# Patient Record
Sex: Male | Born: 1985 | Race: Black or African American | Hispanic: No | Marital: Single | Smoking: Current every day smoker
Health system: Southern US, Community
[De-identification: ages and names within clinical notes are randomized; demographics above are authoritative.]

## PROBLEM LIST (undated history)

## (undated) DIAGNOSIS — F329 Major depressive disorder, single episode, unspecified: Secondary | ICD-10-CM

## (undated) DIAGNOSIS — F319 Bipolar disorder, unspecified: Secondary | ICD-10-CM

## (undated) DIAGNOSIS — F250 Schizoaffective disorder, bipolar type: Secondary | ICD-10-CM

## (undated) DIAGNOSIS — F32A Depression, unspecified: Secondary | ICD-10-CM

## (undated) DIAGNOSIS — F259 Schizoaffective disorder, unspecified: Secondary | ICD-10-CM

---

## 2005-08-01 ENCOUNTER — Emergency Department: Payer: Self-pay | Admitting: Emergency Medicine

## 2010-03-27 ENCOUNTER — Emergency Department: Payer: Self-pay | Admitting: Emergency Medicine

## 2012-08-19 ENCOUNTER — Emergency Department: Payer: Self-pay | Admitting: Emergency Medicine

## 2015-06-30 ENCOUNTER — Encounter: Payer: Self-pay | Admitting: Emergency Medicine

## 2015-06-30 ENCOUNTER — Emergency Department

## 2015-06-30 ENCOUNTER — Emergency Department
Admission: EM | Admit: 2015-06-30 | Discharge: 2015-07-01 | Attending: Emergency Medicine | Admitting: Emergency Medicine

## 2015-06-30 DIAGNOSIS — F17210 Nicotine dependence, cigarettes, uncomplicated: Secondary | ICD-10-CM | POA: Diagnosis not present

## 2015-06-30 DIAGNOSIS — F250 Schizoaffective disorder, bipolar type: Secondary | ICD-10-CM | POA: Insufficient documentation

## 2015-06-30 DIAGNOSIS — S40212A Abrasion of left shoulder, initial encounter: Secondary | ICD-10-CM | POA: Insufficient documentation

## 2015-06-30 DIAGNOSIS — X58XXXA Exposure to other specified factors, initial encounter: Secondary | ICD-10-CM | POA: Diagnosis not present

## 2015-06-30 DIAGNOSIS — Y929 Unspecified place or not applicable: Secondary | ICD-10-CM | POA: Insufficient documentation

## 2015-06-30 DIAGNOSIS — M545 Low back pain, unspecified: Secondary | ICD-10-CM

## 2015-06-30 DIAGNOSIS — Y939 Activity, unspecified: Secondary | ICD-10-CM | POA: Insufficient documentation

## 2015-06-30 DIAGNOSIS — Y999 Unspecified external cause status: Secondary | ICD-10-CM | POA: Insufficient documentation

## 2015-06-30 DIAGNOSIS — F319 Bipolar disorder, unspecified: Secondary | ICD-10-CM | POA: Insufficient documentation

## 2015-06-30 DIAGNOSIS — M25531 Pain in right wrist: Secondary | ICD-10-CM | POA: Insufficient documentation

## 2015-06-30 DIAGNOSIS — T148XXA Other injury of unspecified body region, initial encounter: Secondary | ICD-10-CM

## 2015-06-30 DIAGNOSIS — M25512 Pain in left shoulder: Secondary | ICD-10-CM | POA: Diagnosis present

## 2015-06-30 DIAGNOSIS — T1490XA Injury, unspecified, initial encounter: Secondary | ICD-10-CM

## 2015-06-30 DIAGNOSIS — R52 Pain, unspecified: Secondary | ICD-10-CM

## 2015-06-30 HISTORY — DX: Depression, unspecified: F32.A

## 2015-06-30 HISTORY — DX: Bipolar disorder, unspecified: F31.9

## 2015-06-30 HISTORY — DX: Schizoaffective disorder, unspecified: F25.9

## 2015-06-30 HISTORY — DX: Schizoaffective disorder, bipolar type: F25.0

## 2015-06-30 HISTORY — DX: Major depressive disorder, single episode, unspecified: F32.9

## 2015-06-30 LAB — RAPID HIV SCREEN (HIV 1/2 AB+AG)
HIV 1/2 Antibodies: NONREACTIVE
HIV-1 P24 Antigen - HIV24: NONREACTIVE
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
## 2015-06-30 NOTE — ED Notes (Signed)
MD at bedside. 
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
## 2015-06-30 NOTE — ED Notes (Signed)
Patient transported to X-ray. Pt ambulated with steady gait.

## 2015-06-30 NOTE — Discharge Instructions (Signed)
You may take Tylenol and/or ibuprofen as needed for discomfort. Return to the ER for worsening symptoms, persistent vomiting, difficulty breathing or other concerns.  Back Pain, Adult Back pain is very common in adults.The cause of back pain is rarely dangerous and the pain often gets better over time.The cause of your back pain may not be known. Some common causes of back pain include:  Strain of the muscles or ligaments supporting the spine.  Wear and tear (degeneration) of the spinal disks.  Arthritis.  Direct injury to the back. For many people, back pain may return. Since back pain is rarely dangerous, most people can learn to manage this condition on their own. HOME CARE INSTRUCTIONS Watch your back pain for any changes. The following actions may help to lessen any discomfort you are feeling:  Remain active. It is stressful on your back to sit or stand in one place for long periods of time. Do not sit, drive, or stand in one place for more than 30 minutes at a time. Take short walks on even surfaces as soon as you are able.Try to increase the length of time you walk each day.  Exercise regularly as directed by your health care provider. Exercise helps your back heal faster. It also helps avoid future injury by keeping your muscles strong and flexible.  Do not stay in bed.Resting more than 1-2 days can delay your recovery.  Pay attention to your body when you bend and lift. The most comfortable positions are those that put less stress on your recovering back. Always use proper lifting techniques, including:  Bending your knees.  Keeping the load close to your body.  Avoiding twisting.  Find a comfortable position to sleep. Use a firm mattress and lie on your side with your knees slightly bent. If you lie on your back, put a pillow under your knees.  Avoid feeling anxious or stressed.Stress increases muscle tension and can worsen back pain.It is important to recognize when  you are anxious or stressed and learn ways to manage it, such as with exercise.  Take medicines only as directed by your health care provider. Over-the-counter medicines to reduce pain and inflammation are often the most helpful.Your health care provider may prescribe muscle relaxant drugs.These medicines help dull your pain so you can more quickly return to your normal activities and healthy exercise.  Apply ice to the injured area:  Put ice in a plastic bag.  Place a towel between your skin and the bag.  Leave the ice on for 20 minutes, 2-3 times a day for the first 2-3 days. After that, ice and heat may be alternated to reduce pain and spasms.  Maintain a healthy weight. Excess weight puts extra stress on your back and makes it difficult to maintain good posture. SEEK MEDICAL CARE IF:  You have pain that is not relieved with rest or medicine.  You have increasing pain going down into the legs or buttocks.  You have pain that does not improve in one week.  You have night pain.  You lose weight.  You have a fever or chills. SEEK IMMEDIATE MEDICAL CARE IF:   You develop new bowel or bladder control problems.  You have unusual weakness or numbness in your arms or legs.  You develop nausea or vomiting.  You develop abdominal pain.  You feel faint.   This information is not intended to replace advice given to you by your health care provider. Make sure you discuss any  questions you have with your health care provider.   Document Released: 03/13/2005 Document Revised: 04/03/2014 Document Reviewed: 07/15/2013 Elsevier Interactive Patient Education 2016 Else[employer]  Shoulder Pain The shoulder is the joint that connects your arms to your body. The bones that form the shoulder joint include the upper arm bone (humerus), the shoulder blade (scapula), and the collarbone (clavicle). The top of the humerus is shaped like a ball and fits into a rather flat socket on the scapula  (glenoid cavity). A combination of muscles and strong, fibrous tissues that connect muscles to bones (tendons) support your shoulder joint and hold the ball in the socket. Small, fluid-filled sacs (bursae) are located in different areas of the joint. They act as cushions between the bones and the overlying soft tissues and help reduce friction between the gliding tendons and the bone as you move your arm. Your shoulder joint allows a wide range of motion in your arm. This range of motion allows you to do things like scratch your back or throw a ball. However, this range of motion also makes your shoulder more prone to pain from overuse and injury. Causes of shoulder pain can originate from both injury and overuse and usually can be grouped in the following four categories:  Redness, swelling, and pain (inflammation) of the tendon (tendinitis) or the bursae (bursitis).  Instability, such as a dislocation of the joint.  Inflammation of the joint (arthritis).  Broken bone (fracture). HOME CARE INSTRUCTIONS   Apply ice to the sore area.  Put ice in a plastic bag.  Place a towel between your skin and the bag.  Leave the ice on for 15-20 minutes, 3-4 times per day for the first 2 days, or as directed by your health care provider.  Stop using cold packs if they do not help with the pain.  If you have a shoulder sling or immobilizer, wear it as long as your caregiver instructs. Only remove it to shower or bathe. Move your arm as little as possible, but keep your hand moving to prevent swelling.  Squeeze a soft ball or foam pad as much as possible to help prevent swelling.  Only take over-the-counter or prescription medicines for pain, discomfort, or fever as directed by your caregiver. SEEK MEDICAL CARE IF:   Your shoulder pain increases, or new pain develops in your arm, hand, or fingers.  Your hand or fingers become cold and numb.  Your pain is not relieved with medicines. SEEK IMMEDIATE  MEDICAL CARE IF:   Your arm, hand, or fingers are numb or tingling.  Your arm, hand, or fingers are significantly swollen or turn white or blue. MAKE SURE YOU:   Understand these instructions.  Will watch your condition.  Will get help right away if you are not doing well or get worse.   This information is not intended to replace advice given to you by your health care provider. Make sure you discuss any questions you have with your health care provider.   Document Released: 12/21/2004 Document Revised: 04/03/2014 Document Reviewed: 07/06/2014 Elsevier Interactive Patient Education 2016 Else[employer] Inc.  Wrist Pain There are many things that can cause wrist pain. Some common causes include:  An injury to the wrist area, such as a sprain, strain, or fracture.  Overuse of the joint.  A condition that causes increased pressure on a nerve in the wrist (carpal tunnel syndrome).  Wear and tear of the joints that occurs with aging (osteoarthritis).  A variety of  other types of arthritis. Sometimes, the cause of wrist pain is not known. The pain often goes away when you follow your health care provider's instructions for relieving pain at home. If your wrist pain continues, tests may need to be done to diagnose your condition. HOME CARE INSTRUCTIONS Pay attention to any changes in your symptoms. Take these actions to help with your pain:  Rest the wrist area for at least 48 hours or as told by your health care provider.  If directed, apply ice to the injured area:  Put ice in a plastic bag.  Place a towel between your skin and the bag.  Leave the ice on for 20 minutes, 2-3 times per day.  Keep your arm raised (elevated) above the level of your heart while you are sitting or lying down.  If a splint or elastic bandage has been applied, use it as told by your health care provider.  Remove the splint or bandage only as told by your health care provider.  Loosen the splint or  bandage if your fingers become numb or have a tingling feeling, or if they turn cold or blue.  Take over-the-counter and prescription medicines only as told by your health care provider.  Keep all follow-up visits as told by your health care provider. This is important. SEEK MEDICAL CARE IF:  Your pain is not helped by treatment.  Your pain gets worse. SEEK IMMEDIATE MEDICAL CARE IF:  Your fingers become swollen.  Your fingers turn white, very red, or cold and blue.  Your fingers are numb or have a tingling feeling.  You have difficulty moving your fingers.   This information is not intended to replace advice given to you by your health care provider. Make sure you discuss any questions you have with your health care provider.   Document Released: 12/21/2004 Document Revised: 12/02/2014 Document Reviewed: 07/29/2014 Elsevier Interactive Patient Education [employer]

## 2015-07-01 NOTE — ED Notes (Signed)
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
## 2016-12-14 ENCOUNTER — Emergency Department
Admission: EM | Admit: 2016-12-14 | Discharge: 2016-12-14 | Disposition: A | Discharge status: Home / Self Care | Payer: Self-pay | Attending: Student in an Organized Health Care Education/Training Program | Admitting: Student in an Organized Health Care Education/Training Program

## 2016-12-14 DIAGNOSIS — R55 Syncope and collapse: Secondary | ICD-10-CM | POA: Insufficient documentation

## 2016-12-14 DIAGNOSIS — T672XXA Heat cramp, initial encounter: Secondary | ICD-10-CM | POA: Insufficient documentation

## 2016-12-14 DIAGNOSIS — E860 Dehydration: Secondary | ICD-10-CM | POA: Insufficient documentation

## 2016-12-14 DIAGNOSIS — F17210 Nicotine dependence, cigarettes, uncomplicated: Secondary | ICD-10-CM | POA: Insufficient documentation

## 2016-12-14 LAB — COMPREHENSIVE METABOLIC PANEL
ALBUMIN: 5 g/dL (ref 3.5–5.0)
ALK PHOS: 40 U/L (ref 38–126)
ALT: 22 U/L (ref 17–63)
ANION GAP: 14 (ref 5–15)
AST: 39 U/L (ref 15–41)
BILIRUBIN TOTAL: 1.1 mg/dL (ref 0.3–1.2)
BUN: 17 mg/dL (ref 6–20)
CALCIUM: 10.3 mg/dL (ref 8.9–10.3)
CO2: 21 mmol/L — ABNORMAL LOW (ref 22–32)
CREATININE: 1.17 mg/dL (ref 0.61–1.24)
Chloride: 99 mmol/L — ABNORMAL LOW (ref 101–111)
GFR calc Af Amer: >60 mL/min (ref >60)
GFR calc non Af Amer: >60 mL/min (ref >60)
GLUCOSE: 96 mg/dL (ref 65–99)
Potassium: 3.1 mmol/L — ABNORMAL LOW (ref 3.5–5.1)
Sodium: 134 mmol/L — ABNORMAL LOW (ref 135–145)
TOTAL PROTEIN: 8.6 g/dL — AB (ref 6.5–8.1)

## 2016-12-14 LAB — CK
Total CK: 403 U/L — ABNORMAL HIGH (ref 49–397)
Total CK: 345 U/L (ref 49–397)

## 2016-12-14 LAB — CBC WITH DIFFERENTIAL/PLATELET
Basophils Absolute: 0.1 10*3/uL (ref 0–0.1)
Basophils Relative: 1 %
EOS ABS: 0.1 10*3/uL (ref 0–0.7)
Eosinophils Relative: 2 %
HEMATOCRIT: 44.4 % (ref 40.0–52.0)
HEMOGLOBIN: 15.2 g/dL (ref 13.0–18.0)
LYMPHS ABS: 3.9 10*3/uL — AB (ref 1.0–3.6)
Lymphocytes Relative: 50 %
MCH: 30.4 pg (ref 26.0–34.0)
MCHC: 34.3 g/dL (ref 32.0–36.0)
MCV: 88.7 fL (ref 80.0–100.0)
MONO ABS: 0.6 10*3/uL (ref 0.2–1.0)
MONOS PCT: 8 %
NEUTROS ABS: 3 10*3/uL (ref 1.4–6.5)
NEUTROS PCT: 39 %
Platelets: 244 10*3/uL (ref 150–440)
RBC: 5.01 MIL/uL (ref 4.40–5.90)
RDW: 13.3 % (ref 11.5–14.5)
WBC: 7.6 10*3/uL (ref 3.8–10.6)

## 2016-12-14 MED ORDER — SODIUM CHLORIDE 0.9 % IV BOLUS (SEPSIS)
1000.0000 mL | Freq: Once | INTRAVENOUS | Status: AC
  Administered 2016-12-14: 1000 mL via INTRAVENOUS

## 2016-12-14 MED ORDER — POTASSIUM CHLORIDE CRYS ER 20 MEQ PO TBCR
30.0000 meq | EXTENDED_RELEASE_TABLET | Freq: Once | ORAL | Status: AC
  Administered 2016-12-14: 30 meq via ORAL
  Filled 2016-12-14: qty 2
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
## 2016-12-14 NOTE — ED Triage Notes (Signed)
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

## 2016-12-14 NOTE — ED Notes (Signed)
See triage note for information about what brought pt to the ED

## 2017-08-16 ENCOUNTER — Emergency Department: Payer: Self-pay

## 2017-08-16 ENCOUNTER — Emergency Department
Admission: EM | Admit: 2017-08-16 | Discharge: 2017-08-16 | Disposition: A | Discharge status: Home / Self Care | Payer: Self-pay | Attending: Emergency Medicine | Admitting: Emergency Medicine

## 2017-08-16 ENCOUNTER — Other Ambulatory Visit: Payer: Self-pay

## 2017-08-16 ENCOUNTER — Encounter: Payer: Self-pay | Admitting: *Deleted

## 2017-08-16 DIAGNOSIS — S70361A Insect bite (nonvenomous), right thigh, initial encounter: Secondary | ICD-10-CM | POA: Insufficient documentation

## 2017-08-16 DIAGNOSIS — Y939 Activity, unspecified: Secondary | ICD-10-CM | POA: Insufficient documentation

## 2017-08-16 DIAGNOSIS — Y999 Unspecified external cause status: Secondary | ICD-10-CM | POA: Insufficient documentation

## 2017-08-16 DIAGNOSIS — F17210 Nicotine dependence, cigarettes, uncomplicated: Secondary | ICD-10-CM | POA: Insufficient documentation

## 2017-08-16 DIAGNOSIS — Y929 Unspecified place or not applicable: Secondary | ICD-10-CM | POA: Insufficient documentation

## 2017-08-16 DIAGNOSIS — W57XXXA Bitten or stung by nonvenomous insect and other nonvenomous arthropods, initial encounter: Secondary | ICD-10-CM | POA: Insufficient documentation

## 2017-08-16 DIAGNOSIS — J039 Acute tonsillitis, unspecified: Secondary | ICD-10-CM | POA: Insufficient documentation

## 2017-08-16 LAB — BASIC METABOLIC PANEL
ANION GAP: 9 (ref 5–15)
BUN: 10 mg/dL (ref 6–20)
CALCIUM: 9.6 mg/dL (ref 8.9–10.3)
CO2: 27 mmol/L (ref 22–32)
Chloride: 99 mmol/L — ABNORMAL LOW (ref 101–111)
Creatinine, Ser: 1.14 mg/dL (ref 0.61–1.24)
GFR calc Af Amer: >60 mL/min (ref >60)
GLUCOSE: 97 mg/dL (ref 65–99)
Potassium: 3.6 mmol/L (ref 3.5–5.1)
SODIUM: 135 mmol/L (ref 135–145)

## 2017-08-16 LAB — CBC WITH DIFFERENTIAL/PLATELET
BASOS ABS: 0 10*3/uL (ref 0–0.1)
BASOS PCT: 0 %
EOS PCT: 0 %
Eosinophils Absolute: 0 10*3/uL (ref 0–0.7)
HCT: 43.6 % (ref 40.0–52.0)
Hemoglobin: 15 g/dL (ref 13.0–18.0)
Lymphocytes Relative: 9 %
Lymphs Abs: 0.8 10*3/uL — ABNORMAL LOW (ref 1.0–3.6)
MCH: 30.6 pg (ref 26.0–34.0)
MCHC: 34.3 g/dL (ref 32.0–36.0)
MCV: 89.2 fL (ref 80.0–100.0)
MONO ABS: 1 10*3/uL (ref 0.2–1.0)
Monocytes Relative: 12 %
Neutro Abs: 6.9 10*3/uL — ABNORMAL HIGH (ref 1.4–6.5)
Neutrophils Relative %: 79 %
Platelets: 240 10*3/uL (ref 150–440)
RBC: 4.89 MIL/uL (ref 4.40–5.90)
RDW: 14.1 % (ref 11.5–14.5)
WBC: 8.8 10*3/uL (ref 3.8–10.6)

## 2017-08-16 MED ORDER — SODIUM CHLORIDE 0.9 % IV BOLUS
1000.0000 mL | Freq: Once | INTRAVENOUS | Status: AC
  Administered 2017-08-16: 1000 mL via INTRAVENOUS

## 2017-08-16 MED ORDER — AMOXICILLIN 875 MG PO TABS: 875.0000 mg | ORAL_TABLET | Freq: Two times a day (BID) | ORAL | 0 refills | Status: AC

## 2017-08-16 MED ORDER — ACETAMINOPHEN 500 MG PO TABS
1000.0000 mg | ORAL_TABLET | Freq: Once | ORAL | Status: AC
  Administered 2017-08-16: 1000 mg via ORAL

## 2017-08-16 MED ORDER — IOPAMIDOL (ISOVUE-300) INJECTION 61%
75.0000 mL | Freq: Once | INTRAVENOUS | Status: AC | PRN
  Administered 2017-08-16: 100 mL via INTRAVENOUS

## 2017-08-16 MED ORDER — ACETAMINOPHEN 500 MG PO TABS
ORAL_TABLET | ORAL | Status: AC
  Administered 2017-08-16: 1000 mg via ORAL
  Filled 2017-08-16: qty 2

## 2017-08-16 MED ORDER — DOXYCYCLINE HYCLATE 100 MG PO CAPS: 100.0000 mg | ORAL_CAPSULE | Freq: Two times a day (BID) | ORAL | 0 refills | Status: AC

## 2017-08-16 MED ORDER — KETOROLAC TROMETHAMINE 30 MG/ML IJ SOLN
15.0000 mg | INTRAMUSCULAR | Status: AC
  Administered 2017-08-16: 15 mg via INTRAVENOUS
  Filled 2017-08-16: qty 1

## 2017-08-16 NOTE — ED Provider Notes (Signed)
[facility] Emergency Department Provider Note  ____________________________________________  Time seen: Approximately 6:13 PM  I have reviewed the triage vital signs and the nursing notes.   HISTORY  Chief Complaint Headache    HPI Jared Burgess is a 32 y.o. male who complains of generalized headache, fever, body aches and fatigue that started yesterday. Constant, no aggravating or alleviating factors. Denies neck pain or stiffness. No vision changes. Headache is bilateral frontal. He also reports that he was bitten by 2 techs otic ago. He estimates throughout his body sometime between 2 and 24 hours. He removed them himself without difficulty and brings them in a small box. They appear to be about 1-2 mm in width. Pain is aching and moderate intensity.   denies rash   Past Medical History:  Diagnosis Date  . Bipolar 1 disorder (HCC)   . Depression   . Schizo-affective schizophrenia (HCC)      There are no active problems to display for this patient.    History reviewed. No pertinent surgical history.   Prior to Admission medications   Medication Sig Start Date End Date Taking? Authorizing Provider  amoxicillin (AMOXIL) 875 MG tablet Take 1 tablet (875 mg total) by mouth 2 (two) times daily. 08/16/17   Sharman Cheek, MD  doxycycline (VIBRAMYCIN) 100 MG capsule Take 1 capsule (100 mg total) by mouth 2 (two) times daily. 08/16/17   Sharman Cheek, MD     Allergies Patient has no known allergies.   History reviewed. No pertinent family history.  Social History Social History   Tobacco Use  . Smoking status: Current Every Day Smoker    Packs/day: 0.50    Types: Cigarettes  . Smokeless tobacco: Never Used  Substance Use Topics  . Alcohol use: Yes    Alcohol/week: 1.8 oz    Types: 3 Cans of beer per week  . Drug use: No    Review of Systems  Constitutional:   positive fever and chills.  ENT:   Positive sore throat. No  rhinorrhea. positive right neck pain Cardiovascular:   No chest pain or syncope. Respiratory:   No dyspnea or cough. Gastrointestinal:   Negative for abdominal pain, vomiting and diarrhea.  Musculoskeletal:   Negative for focal pain or swelling All other systems reviewed and are negative except as documented above in ROS and HPI.  ____________________________________________   PHYSICAL EXAM:  VITAL SIGNS: ED Triage Vitals  Enc Vitals Group     BP 08/16/17 1416 (!) 175/97     Pulse Rate 08/16/17 1416 (!) 112     Resp 08/16/17 1416 16     Temp 08/16/17 1416 100.3 F (37.9 C)     Temp Source 08/16/17 1416 Oral     SpO2 08/16/17 1416 100 %     Weight 08/16/17 1417 160 lb (72.6 kg)     Height 08/16/17 1417 5' 10.5" (1.791 m)     Head Circumference --      Peak Flow --      Pain Score 08/16/17 1416 10     Pain Loc --      Pain Edu? --      Excl. in GC? --     Vital signs reviewed, nursing assessments reviewed.   Constitutional:   Alert and oriented. Well appearing and in no distress. Eyes:   Conjunctivae are normal. EOMI. PERRL. ENT      Head:   Normocephalic and atraumatic.      Nose:  No congestion/rhinnorhea.       Mouth/Throat:   MMM,bright pharyngeal erythema with bilateral tonsillar enlargement. Right tonsil with exudates and mucosal ulceration..       Neck:   No meningismus. Full ROM.no midline tenderness Hematological/Lymphatic/Immunilogical:   right anterior cervical lymphadenopathy. No inguinal lymphadenopathy. Cardiovascular:   RRR. Symmetric bilateral radial and DP pulses.  No murmurs.  Respiratory:   Normal respiratory effort without tachypnea/retractions. Breath sounds are clear and equal bilaterally. No wheezes/rales/rhonchi. Gastrointestinal:   Soft and nontender. Non distended. There is no CVA tenderness.  No rebound, rigidity, or guarding.  Musculoskeletal:   Normal range of motion in all extremities. No joint effusions.  No lower extremity tenderness.  No  edema. Neurologic:   Normal speech and language.  Motor grossly intact. No acute focal neurologic deficits are appreciated.  Skin:    Skin is warm, dry and intact. No rash noted. no target lesion. To take bites identified, on bilateral thighs, noninflamed. No evidence of abscess. No petechiae, purpura, or bullae.  ____________________________________________    LABS (pertinent positives/negatives) (all labs ordered are listed, but only abnormal results are displayed) Labs Reviewed  BASIC METABOLIC PANEL - Abnormal; Notable for the following components:      Result Value   Chloride 99 (*)    All other components within normal limits  CBC WITH DIFFERENTIAL/PLATELET - Abnormal; Notable for the following components:   Neutro Abs 6.9 (*)    Lymphs Abs 0.8 (*)    All other components within normal limits  ROCKY MTN SPOTTED FVR ABS PNL(IGG+IGM)  B. BURGDORFI ANTIBODIES   ____________________________________________   EKG    ____________________________________________    RADIOLOGY  Ct Soft Tissue Neck W Contrast  Result Date: 08/16/2017 CLINICAL DATA:  Initial evaluation for acute sore throat, fever. EXAM: CT NECK WITH CONTRAST TECHNIQUE: Multidetector CT imaging of the neck was performed using the standard protocol following the bolus administration of intravenous contrast. CONTRAST:  ISOVUE-300 IOPAMIDOL (ISOVUE-300) INJECTION 61% COMPARISON:  None. FINDINGS: Pharynx and larynx: Oral cavity within normal limits without mass lesion or loculated fluid collection. No acute abnormality about the dentition. Symmetric enlargement of the palatine tonsils bilaterally, which could reflect acute tonsillitis. No discrete tonsillar or peritonsillar abscess. Adjacent parapharyngeal fat maintained. Nasopharynx normal. No retropharyngeal swelling or collection. Epiglottis normal. Vallecula clear. Remainder of the hypopharynx and supraglottic larynx within normal limits. True cords apposed and  not well evaluated. Subglottic airway clear. Salivary glands: Salivary glands including the parotid and submandibular glands within normal limits. Thyroid: Normal. Lymph nodes: Mildly prominent bilateral level II lymph nodes measure up to 12 mm on the left and 14 mm on the right, likely reactive. No other adenopathy within the neck. Vascular: Normal intravascular enhancement seen throughout the neck. Limited intracranial: Unremarkable. Visualized orbits: Visualized globes and orbital soft tissues within normal limits. Mastoids and visualized paranasal sinuses: Small left maxillary sinus retention cyst. Paranasal sinuses are otherwise clear. Mastoid air cells and middle ear cavities are clear. Skeleton: No acute osseous abnormality. No worrisome lytic or blastic osseous lesions. Upper chest: Visualized upper chest within normal limits. Partially visualized lungs are clear. Other: None. IMPRESSION: 1. Prominent and mildly enlarged palatine tonsils bilaterally, suggestive of acute tonsillitis given provided history. No discrete tonsillar or peritonsillar abscess identified. 2. Mildly enlarged bilateral level II lymph nodes, likely reactive. 3. Normal epiglottis. Electronically Signed   By: Rise Mu M.D.   On: 08/16/2017 16:53    ____________________________________________   PROCEDURES Procedures  ____________________________________________  DIFFERENTIAL DIAGNOSIS   peritonsillar abscess, retropharyngeal abscess, tonsillitis, tickborne illness. Doubt meningitis or encephalitis.  CLINICAL IMPRESSION / ASSESSMENT AND PLAN / ED COURSE  Pertinent labs & imaging results that were available during my care of the patient were reviewed by me and considered in my medical decision making (see chart for details).      Clinical Course as of Aug 17 1811  Thu Aug 16, 2017  1613 Hr 90 on my exam. No meningismus. Not septic. RMSF vs PTA. Will obtain labs, IVF for hydration, toradol for pain, check  CT neck to eval possible abscess.   [PS]  1715 CT negative for PTA/rpa. C/w tonsillitis. Will tx with oral abx. Labs reassuring.    [PS]    Clinical Course User Index [PS] Sharman Cheek, MD     ____________________________________________   FINAL CLINICAL IMPRESSION(S) / ED DIAGNOSES    Final diagnoses:  Tonsillitis  Tick bite, initial encounter     ED Discharge Orders        Ordered    amoxicillin (AMOXIL) 875 MG tablet  2 times daily     08/16/17 1813    doxycycline (VIBRAMYCIN) 100 MG capsule  2 times daily     08/16/17 1813      Portions of this note were generated with dragon dictation software. Dictation errors may occur despite best attempts at proofreading.    Sharman Cheek, MD 08/16/17 [phone]

## 2017-08-16 NOTE — ED Triage Notes (Addendum)
Pt to ED reporting headache and fever since yesterday. No neuro deficits noted. Temp is 100.3 in  Triage. Pt reports having been taking dayquil but no tylenol or motrin. No NVD. Pt also reports having been bitten last week by two ticks. One on the groin and the other on the right thigh. No bullseye reported.

## 2017-08-17 LAB — B. BURGDORFI ANTIBODIES

## 2017-08-18 LAB — ROCKY MTN SPOTTED FVR ABS PNL(IGG+IGM)
RMSF IGM: 1.14 {index} — AB (ref 0.00–0.89)
RMSF IgG: NEGATIVE

## 2017-08-20 ENCOUNTER — Telehealth: Payer: Self-pay | Admitting: Emergency Medicine

## 2017-08-20 NOTE — Telephone Encounter (Signed)
Called patient to give results of rmsf.  He says he is taking the doxycycline.  Continues to have headaches and is taking advil for that.  He does not have pcp nor insurance.  Explained [facility] and gave him the phone number.  I toldhim he can do ED follow up with [facility], and to return here if he gets worse.

## 2018-12-29 IMAGING — CT CT NECK W/ CM
4 of 6 series · 15 of 33 positions shown, 17 images · IV contrast (iopamidol)
Comparison: None.

CLINICAL DATA: Initial evaluation for acute sore throat, fever.

EXAM:
CT NECK WITH CONTRAST
TECHNIQUE: Multidetector CT imaging of the neck was performed using the
standard protocol following the bolus administration of intravenous
contrast.
CONTRAST:  100mL [id number] IOPAMIDOL ([id number]) INJECTION 61%

[Series 2: axial neck · axial · 0.65mm/px · z∈[+278,+374]mm · 2 of 144 slices shown]
[im 48/144  bone]
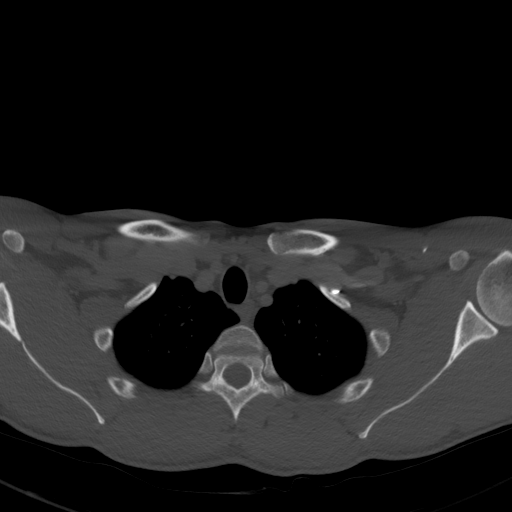
[im 96/144  bone]
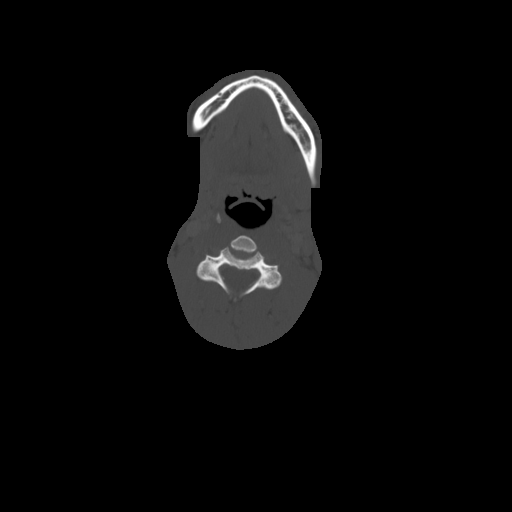

[Series 6: sag neck · sagittal · 0.61mm/px · 5 of 101 slices shown]
[im 17/101  bone]
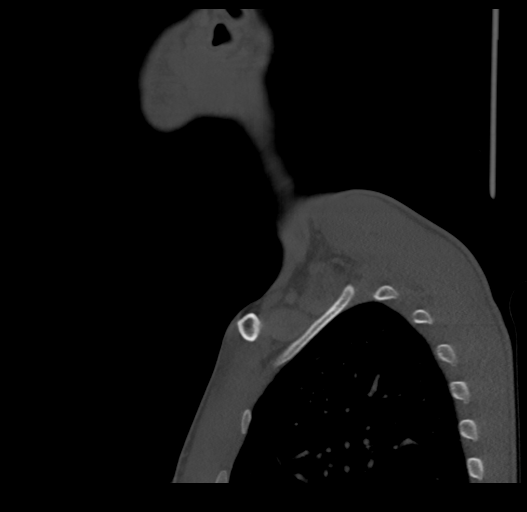
[im 34/101  bone]
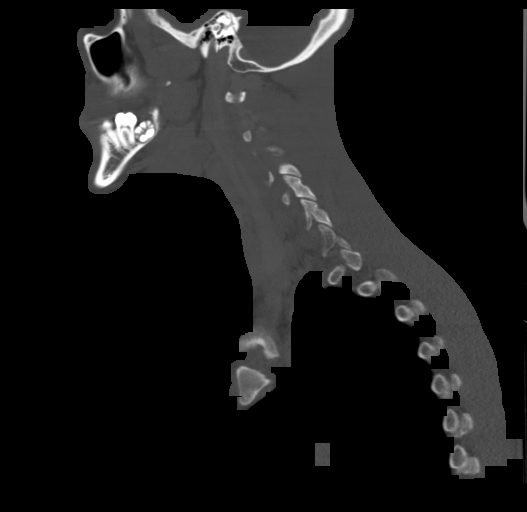
[im 51/101  bone]
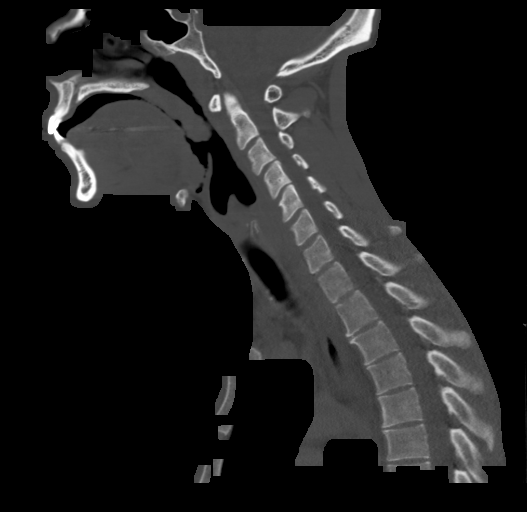
[im 67/101  bone]
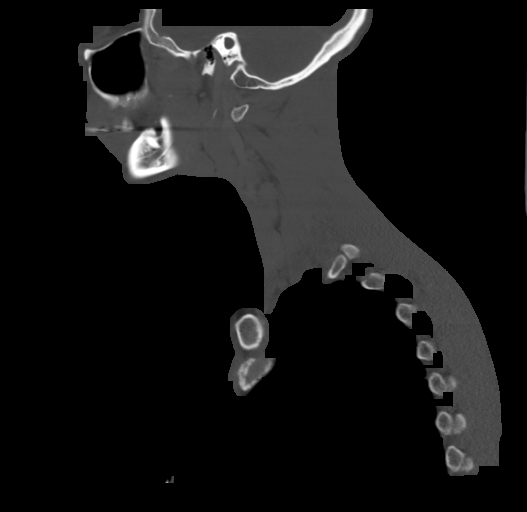
[im 84/101  bone]
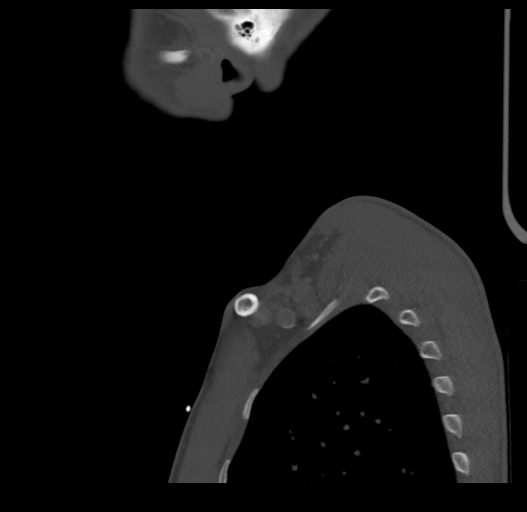

[Series 7: cor neck · coronal · 0.58mm/px · 3 of 151 slices shown]
[im 31/151  bone]
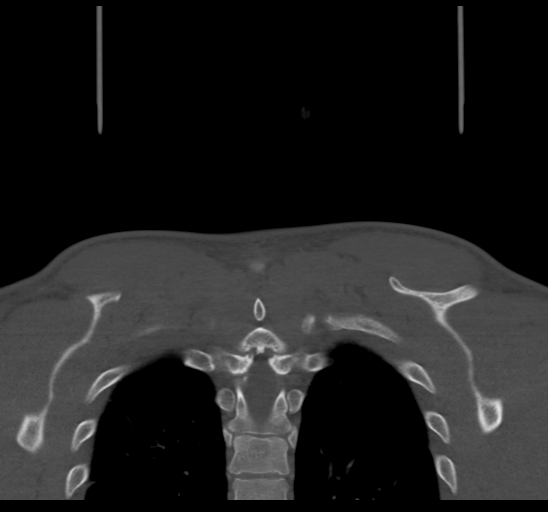
[im 61/151  bone]
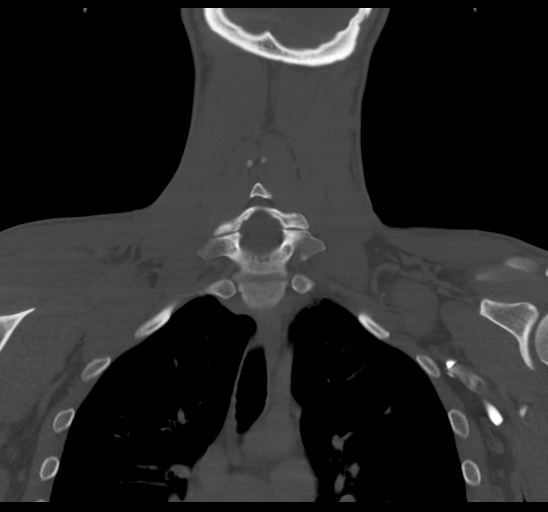
[im 91/151  bone]
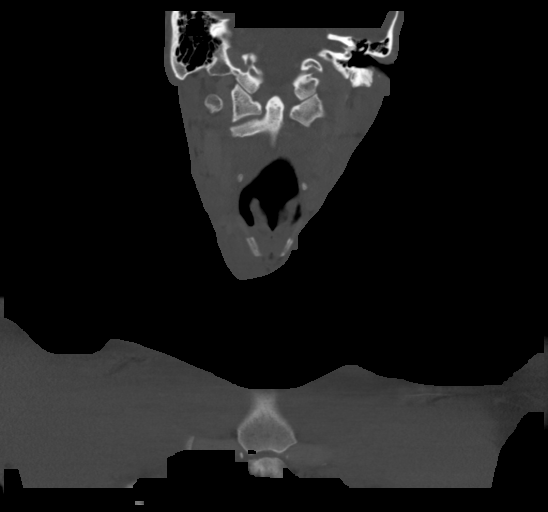

[Series 8: orthogonal ax · axial · 0.50mm/px · z∈[+159,+398]mm · 5 of 191 slices shown, 7 images]
[im 32/191  soft-tissue]
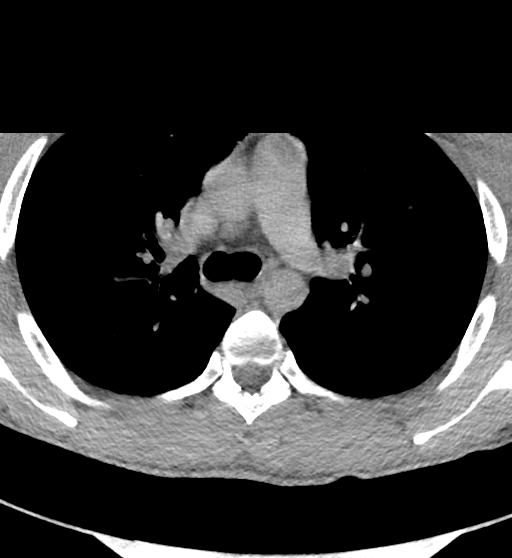
[im 32/191  bone]
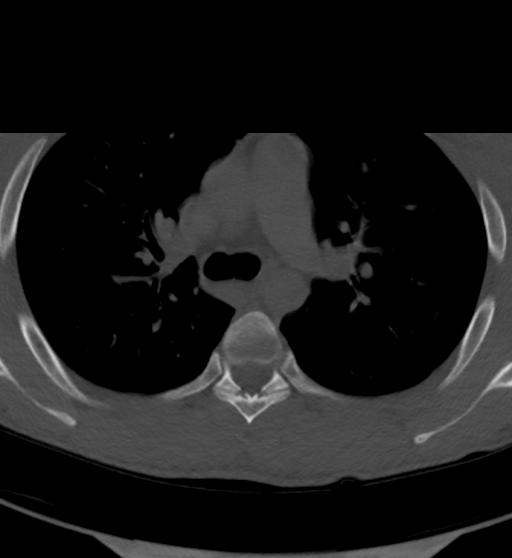
[im 64/191  bone]
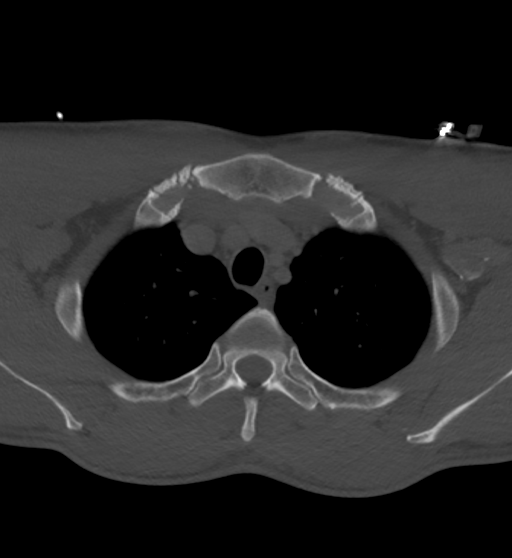
[im 96/191  bone]
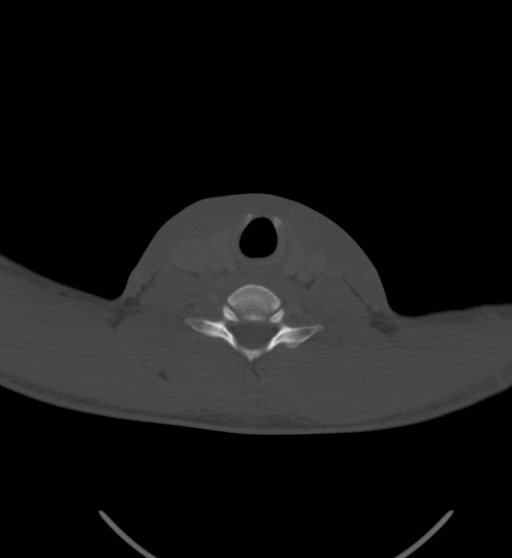
[im 127/191  bone]
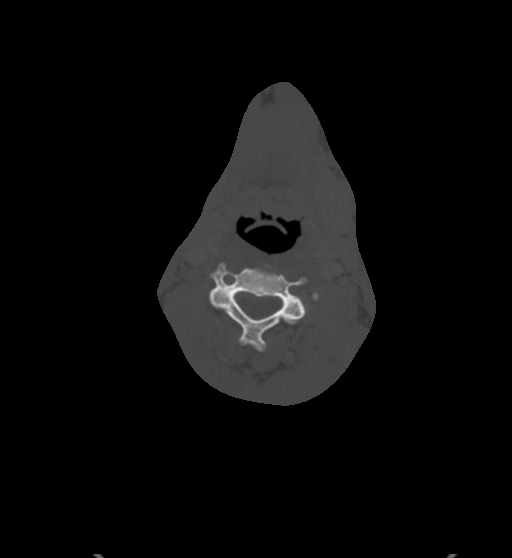
[im 159/191  soft-tissue]
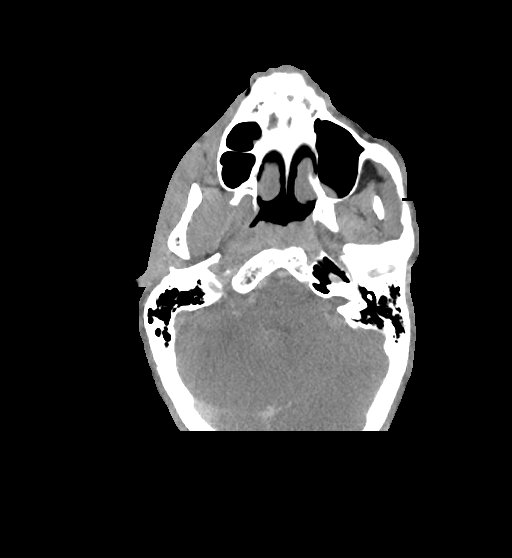
[im 159/191  bone]
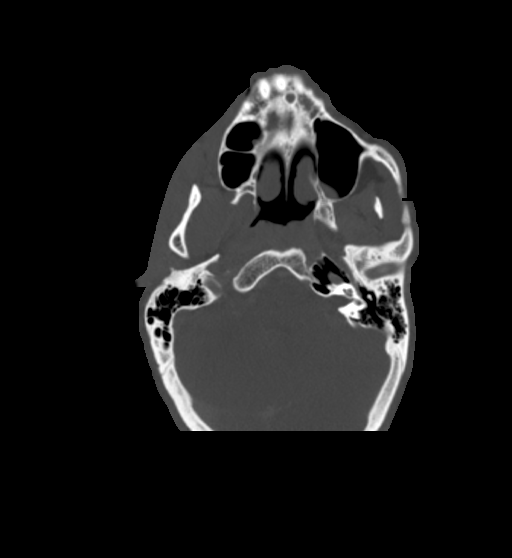

[15 of 33 positions shown; findings below may reference images not displayed]

FINDINGS: Pharynx and larynx: Oral cavity within normal limits without mass
lesion or loculated fluid collection. No acute abnormality about the
dentition. Symmetric enlargement of the palatine tonsils
bilaterally, which could reflect acute tonsillitis. No discrete
tonsillar or peritonsillar abscess. Adjacent parapharyngeal fat
maintained. Nasopharynx normal. No retropharyngeal swelling or
collection. Epiglottis normal. Vallecula clear. Remainder of the
hypopharynx and supraglottic larynx within normal limits. True cords
apposed and not well evaluated. Subglottic airway clear.

Salivary glands: Salivary glands including the parotid and
submandibular glands within normal limits.

Thyroid: Normal.

Lymph nodes: Mildly prominent bilateral level II lymph nodes measure
up to 12 mm on the left and 14 mm on the right, likely reactive. No
other adenopathy within the neck.

Vascular: Normal intravascular enhancement seen throughout the neck.

Limited intracranial: Unremarkable.

Visualized orbits: Visualized globes and orbital soft tissues within
normal limits.

Mastoids and visualized paranasal sinuses: Small left maxillary
sinus retention cyst. Paranasal sinuses are otherwise clear. Mastoid
air cells and middle ear cavities are clear.

Skeleton: No acute osseous abnormality. No worrisome lytic or
blastic osseous lesions.

Upper chest: Visualized upper chest within normal limits. Partially
visualized lungs are clear.

Other: None.
IMPRESSION: 1. Prominent and mildly enlarged palatine tonsils bilaterally,
suggestive of acute tonsillitis given provided history. No discrete
tonsillar or peritonsillar abscess identified.
2. Mildly enlarged bilateral level II lymph nodes, likely reactive.
3. Normal epiglottis.
